# Patient Record
Sex: Male | Born: 2000
Health system: Southern US, Community
[De-identification: ages and names within clinical notes are randomized; demographics above are authoritative.]

## PROBLEM LIST (undated history)

## (undated) DIAGNOSIS — R350 Frequency of micturition: Secondary | ICD-10-CM

## (undated) DIAGNOSIS — S62502A Fracture of unspecified phalanx of left thumb, initial encounter for closed fracture: Secondary | ICD-10-CM

## (undated) HISTORY — PX: LEG TENDON SURGERY: SHX1004

---
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
## 2018-08-20 ENCOUNTER — Telehealth (HOSPITAL_COMMUNITY): Payer: Self-pay

## 2018-08-20 ENCOUNTER — Encounter (HOSPITAL_COMMUNITY): Payer: Self-pay | Admitting: Emergency Medicine

## 2018-08-20 ENCOUNTER — Ambulatory Visit (HOSPITAL_COMMUNITY)
Admission: EM | Admit: 2018-08-20 | Discharge: 2018-08-20 | Disposition: A | Discharge status: Home / Self Care | Payer: Federal, State, Local not specified - PPO | Attending: Family Medicine | Admitting: Family Medicine

## 2018-08-20 DIAGNOSIS — F17210 Nicotine dependence, cigarettes, uncomplicated: Secondary | ICD-10-CM | POA: Insufficient documentation

## 2018-08-20 DIAGNOSIS — Z202 Contact with and (suspected) exposure to infections with a predominantly sexual mode of transmission: Secondary | ICD-10-CM | POA: Diagnosis not present

## 2018-08-20 DIAGNOSIS — B009 Herpesviral infection, unspecified: Secondary | ICD-10-CM

## 2018-08-20 DIAGNOSIS — Z113 Encounter for screening for infections with a predominantly sexual mode of transmission: Secondary | ICD-10-CM

## 2018-08-20 DIAGNOSIS — K1379 Other lesions of oral mucosa: Secondary | ICD-10-CM | POA: Diagnosis not present

## 2018-08-20 MED ORDER — AZITHROMYCIN 250 MG PO TABS
ORAL_TABLET | ORAL | Status: AC
  Filled 2018-08-20: qty 4

## 2018-08-20 MED ORDER — AZITHROMYCIN 250 MG PO TABS
1000.0000 mg | ORAL_TABLET | Freq: Once | ORAL | Status: AC
  Administered 2018-08-20: 1000 mg via ORAL

## 2018-08-20 MED ORDER — CEFTRIAXONE SODIUM 250 MG IJ SOLR
INTRAMUSCULAR | Status: AC
  Filled 2018-08-20: qty 250

## 2018-08-20 MED ORDER — CEFTRIAXONE SODIUM 250 MG IJ SOLR
250.0000 mg | Freq: Once | INTRAMUSCULAR | Status: AC
  Administered 2018-08-20: 250 mg via INTRAMUSCULAR

## 2018-08-20 MED ORDER — LIDOCAINE HCL (PF) 1 % IJ SOLN
INTRAMUSCULAR | Status: AC
  Filled 2018-08-20: qty 2

## 2018-08-20 NOTE — Discharge Instructions (Addendum)
Safe sex is up to you.  We can talk about the consequences of getting a sexually transmitted disease all day long.  Some of the sexually transmitted diseases have a lifelong consequences.

## 2018-08-20 NOTE — ED Triage Notes (Signed)
Pt here for "cold sores" to upper lip

## 2018-08-20 NOTE — ED Provider Notes (Signed)
[facility]    CSN: [id number] Arrival date & time: 08/20/18  1656     History   Chief Complaint Chief Complaint  Patient presents with  . mouth pain    HPI Carl Ibarra is a 17 y.o. male.   Is a 17 year old man who comes in with his parents.  He is sexually active and has developed for the first time vesicles on his upper lip.  He also would like to be treated for gonorrhea since his girlfriend tested positive and was treated at the health department.  He would like to have STI screening.  Patient has no dysuria or discharge.  He has no skin lesions in his perineum.     Past Medical History:  Diagnosis Date  . Fracture of thumb, left, closed 10/13/2015   prox. phalanx - fell from skateboard  . Urinary frequency     Patient Active Problem List   Diagnosis Date Noted  . Fracture of right radius and ulna 01/10/2016    Past Surgical History:  Procedure Laterality Date  . LEG TENDON SURGERY    . OPEN REDUCTION INTERNAL FIXATION (ORIF) PROXIMAL PHALANX Left 11/04/2015   Procedure: OPEN TREATMENT OF LEFT THUMB PROXIMAL PHALANX FRACTURE;  Surgeon: Mack Hookavid Thompson, MD;  Location: [facility];  Service: Orthopedics;  Laterality: Left;       Home Medications    Prior to Admission medications   Medication Sig Start Date End Date Taking? Authorizing Provider  UNKNOWN TO PATIENT Take 1 tablet by mouth once. Pill unknown to pt.    [provider]    Family History Family History  Problem Relation Age of Onset  . Hypertension Father   . Asthma Father   . Stroke Father     Social History Social History   Tobacco Use  . Smoking status: Current Every Day Smoker    Years: 0.50    Types: Cigarettes  . Smokeless tobacco: Never Used  . Tobacco comment: unknown amount daily, per mother  Substance Use Topics  . Alcohol use: No  . Drug use: No     Allergies   Bee pollen; Bee venom; and Peanut butter flavor   Review of  Systems Review of Systems  Skin: Positive for rash.  All other systems reviewed and are negative.    Physical Exam Triage Vital Signs ED Triage Vitals [08/20/18 1742]  Enc Vitals Group     BP 118/67     Pulse Rate (!) 58     Resp 18     Temp 98.4 F (36.9 C)     Temp Source Oral     SpO2 100 %     Weight      Height      Head Circumference      Peak Flow      Pain Score      Pain Loc      Pain Edu?      Excl. in GC?    No data found.  Updated Vital Signs BP 118/67 (BP Location: Right Arm)   Pulse (!) 58   Temp 98.4 F (36.9 C) (Oral)   Resp 18   SpO2 100%   Physical Exam  Constitutional: He appears well-developed and well-nourished.  HENT:  Patient has 2 vesicles on the vermilion border of his upper lip.  Nursing note and vitals reviewed.    UC Treatments / Results  Labs (all labs ordered are listed, but only abnormal results are  displayed) Labs Reviewed  HIV ANTIBODY (ROUTINE TESTING W REFLEX)  RPR  URINE CYTOLOGY ANCILLARY ONLY    EKG None  Radiology No results found.  Procedures Procedures (including critical care time)  Medications Ordered in UC Medications  azithromycin (ZITHROMAX) tablet 1,000 mg (has no administration in time range)  cefTRIAXone (ROCEPHIN) injection 250 mg (has no administration in time range)    Initial Impression / Assessment and Plan / UC Course  I have reviewed the triage vital signs and the nursing notes.  Pertinent labs & imaging results that were available during my care of the patient were reviewed by me and considered in my medical decision making (see chart for details).    Final Clinical Impressions(s) / UC Diagnoses   Final diagnoses:  HSV-1 (herpes simplex virus 1) infection  STD exposure     Discharge Instructions     Safe sex is up to you.  We can talk about the consequences of getting a sexually transmitted disease all day long.  Some of the sexually transmitted diseases have a lifelong  consequences.    ED Prescriptions    None     Controlled Substance Prescriptions [place] Controlled Substance Registry consulted? Not Applicable   Elvina Sidle, MD 08/20/18 1815

## 2018-08-21 LAB — RPR: RPR Ser Ql: NONREACTIVE

## 2018-08-22 LAB — URINE CYTOLOGY ANCILLARY ONLY
Chlamydia: NEGATIVE
Neisseria Gonorrhea: NEGATIVE
Trichomonas: NEGATIVE

## 2018-08-23 LAB — HIV ANTIBODY (ROUTINE TESTING W REFLEX): HIV Screen 4th Generation wRfx: NONREACTIVE

## 2018-11-01 DIAGNOSIS — Z113 Encounter for screening for infections with a predominantly sexual mode of transmission: Secondary | ICD-10-CM | POA: Diagnosis not present

## 2019-02-07 ENCOUNTER — Ambulatory Visit (INDEPENDENT_AMBULATORY_CARE_PROVIDER_SITE_OTHER): Payer: Federal, State, Local not specified - PPO

## 2019-02-07 ENCOUNTER — Encounter: Payer: Self-pay | Admitting: Family Medicine

## 2019-02-07 ENCOUNTER — Ambulatory Visit: Payer: Federal, State, Local not specified - PPO | Admitting: Family Medicine

## 2019-02-07 ENCOUNTER — Other Ambulatory Visit (HOSPITAL_COMMUNITY)
Admission: RE | Admit: 2019-02-07 | Discharge: 2019-02-07 | Disposition: A | Discharge status: Home / Self Care | Payer: Federal, State, Local not specified - PPO | Source: Ambulatory Visit | Attending: Family Medicine | Admitting: Family Medicine

## 2019-02-07 VITALS — BP 100/60 | HR 64 | Temp 98.0°F | Resp 16 | Ht 68.0 in | Wt 155.0 lb

## 2019-02-07 DIAGNOSIS — M25511 Pain in right shoulder: Secondary | ICD-10-CM

## 2019-02-07 DIAGNOSIS — Z113 Encounter for screening for infections with a predominantly sexual mode of transmission: Secondary | ICD-10-CM | POA: Insufficient documentation

## 2019-02-07 DIAGNOSIS — E559 Vitamin D deficiency, unspecified: Secondary | ICD-10-CM

## 2019-02-07 DIAGNOSIS — Z0001 Encounter for general adult medical examination with abnormal findings: Secondary | ICD-10-CM

## 2019-02-07 DIAGNOSIS — M25521 Pain in right elbow: Secondary | ICD-10-CM

## 2019-02-07 DIAGNOSIS — Z0000 Encounter for general adult medical examination without abnormal findings: Secondary | ICD-10-CM | POA: Diagnosis not present

## 2019-02-07 DIAGNOSIS — B009 Herpesviral infection, unspecified: Secondary | ICD-10-CM

## 2019-02-07 DIAGNOSIS — S59901A Unspecified injury of right elbow, initial encounter: Secondary | ICD-10-CM | POA: Diagnosis not present

## 2019-02-07 DIAGNOSIS — S4991XA Unspecified injury of right shoulder and upper arm, initial encounter: Secondary | ICD-10-CM | POA: Diagnosis not present

## 2019-02-07 DIAGNOSIS — M25531 Pain in right wrist: Secondary | ICD-10-CM

## 2019-02-07 LAB — B12 AND FOLATE PANEL
Folate: 12.3 ng/mL (ref >5.9)
Vitamin B-12: 382 pg/mL (ref 211–911)

## 2019-02-07 LAB — COMPREHENSIVE METABOLIC PANEL
ALBUMIN: 4.1 g/dL (ref 3.5–5.2)
ALK PHOS: 64 U/L (ref 39–117)
ALT: 12 U/L (ref 0–53)
AST: 16 U/L (ref 0–37)
BILIRUBIN TOTAL: 0.6 mg/dL (ref 0.2–1.2)
BUN: 10 mg/dL (ref 6–23)
CO2: 26 mEq/L (ref 19–32)
Calcium: 9 mg/dL (ref 8.4–10.5)
Chloride: 106 mEq/L (ref 96–112)
Creatinine, Ser: 0.93 mg/dL (ref 0.40–1.50)
GFR: 125.2 mL/min (ref >60.00)
GLUCOSE: 101 mg/dL — AB (ref 70–99)
Potassium: 4.2 mEq/L (ref 3.5–5.1)
Sodium: 139 mEq/L (ref 135–145)
TOTAL PROTEIN: 6.6 g/dL (ref 6.0–8.3)

## 2019-02-07 LAB — CBC
HCT: 40.1 % (ref 39.0–52.0)
HEMOGLOBIN: 13.7 g/dL (ref 13.0–17.0)
MCHC: 34.2 g/dL (ref 30.0–36.0)
MCV: 96.6 fl (ref 78.0–100.0)
PLATELETS: 243 10*3/uL (ref 150.0–400.0)
RBC: 4.15 Mil/uL — AB (ref 4.22–5.81)
RDW: 12.5 % (ref 11.5–14.6)
WBC: 5.2 10*3/uL (ref 4.5–10.5)

## 2019-02-07 LAB — VITAMIN D 25 HYDROXY (VIT D DEFICIENCY, FRACTURES): VITD: 16.3 ng/mL — ABNORMAL LOW (ref 30.00–100.00)

## 2019-02-07 NOTE — Progress Notes (Signed)
Subjective:    Patient ID: Carl Ibarra, male    DOB: 2000-12-30, 18 y.o.   MRN: [id number]  HPI   Patient presents to clinic to establish with PCP.  Main concern today is getting a complete physical exam and also STD screening.  Patient states he does see the dentist 1-2 times per year, and sees the eye doctor usually every 2 years.  Does not wear glasses or contacts.  Vaccines are up-to-date for his age.  Patient also fell off of skateboard about a month ago, reached out with his right arm.  Now he has had right shoulder, elbow and wrist pain for the past month.  Notices the pain mainly when reaching arm straight up or reaching arm backward to put jacket on.  Past Medical History:  Diagnosis Date  . Fracture of thumb, left, closed 10/13/2015   prox. phalanx - fell from skateboard  . Urinary frequency    Past Surgical History:  Procedure Laterality Date  . LEG TENDON SURGERY    . OPEN REDUCTION INTERNAL FIXATION (ORIF) PROXIMAL PHALANX Left 11/04/2015   Procedure: OPEN TREATMENT OF LEFT THUMB PROXIMAL PHALANX FRACTURE;  Surgeon: Mack Hook, MD;  Location: [facility];  Service: Orthopedics;  Laterality: Left;   Social History   Tobacco Use  . Smoking status: Current Every Day Smoker    Years: 0.50    Types: Cigarettes  . Smokeless tobacco: Never Used  . Tobacco comment: unknown amount daily, per mother  Substance Use Topics  . Alcohol use: No   Family History  Problem Relation Age of Onset  . Depression Mother   . Hypertension Father   . Asthma Father   . Stroke Father   . Heart attack Father   . Depression Father   . Asthma Sister    Review of Systems  Constitutional: Negative for chills, fatigue and fever.  HENT: Negative for congestion, ear pain, sinus pain and sore throat.   Eyes: Negative.   Respiratory: Negative for cough, shortness of breath and wheezing.   Cardiovascular: Negative for chest pain, palpitations and leg swelling.    Gastrointestinal: Negative for abdominal pain, diarrhea, nausea and vomiting.  Genitourinary: Negative for dysuria, frequency and urgency.  Musculoskeletal: +pain right shoulder, elbow, wrist Skin: Negative for color change, pallor and rash.  Neurological: Negative for syncope, light-headedness and headaches.  Psychiatric/Behavioral: The patient is not nervous/anxious.       Objective:   Physical Exam Vitals signs and nursing note reviewed.  Constitutional:      General: He is not in acute distress.    Appearance: He is not toxic-appearing.  HENT:     Head: Normocephalic and atraumatic.     Right Ear: Tympanic membrane, ear canal and external ear normal.     Left Ear: Tympanic membrane, ear canal and external ear normal.     Nose: Nose normal.     Mouth/Throat:     Mouth: Mucous membranes are moist.  Eyes:     General: No scleral icterus.    Extraocular Movements: Extraocular movements intact.     Pupils: Pupils are equal, round, and reactive to light.  Neck:     Musculoskeletal: Neck supple. No neck rigidity.  Cardiovascular:     Rate and Rhythm: Normal rate and regular rhythm.  Pulmonary:     Effort: Pulmonary effort is normal.     Breath sounds: Normal breath sounds.  Genitourinary:    Comments: Declines GU exam Musculoskeletal:  Right shoulder: He exhibits tenderness.     Right elbow: Tenderness found.     Right wrist: He exhibits tenderness.     Comments: Right shoulder pain with reaching right arm straight up above head and behind.  Able to hold right arm straight out in front of him and resist me pushing down.  Pain with range of motion of right elbow and right wrist.  Grips equal and strong.  Range of motion of fingers intact.  Can make a full fist and full extension of all fingers including thumb without issue.  Lymphadenopathy:     Cervical: No cervical adenopathy.  Skin:    General: Skin is warm and dry.     Coloration: Skin is not jaundiced or pale.   Neurological:     Mental Status: He is alert and oriented to person, place, and time.     Gait: Gait normal.  Psychiatric:        Mood and Affect: Mood normal.        Behavior: Behavior normal.    Vitals:   02/07/19 0928  BP: 100/60  Pulse: 64  Resp: 16  Temp: 98 F (36.7 C)  SpO2: 98%      Assessment & Plan:    Well adult exam/STD screening - blood work drawn today including HIV and RPR and HSV screening.  Urine collected for gonorrhea, chlamydia and trichomonas testing.  Discussed healthy diet and regular exercise.  Patient always wears seatbelt when in car.  Discussed safe sex practices including condom use to prevent STD and pregnancy.  Discussed safe sun practices including wearing sunscreen of SPF at least 50 when outdoors, wearing long sleeves and widebrimmed hat to protect skin of face.  Skateboard injury, right shoulder/right wrist/right elbow pain - pain with range of motion of right upper extremity.  We will get x-rays of joints to further investigate.  Discussed possibility of referral to sports medicine or physical therapy once x-ray results are back.  Advised he can use Motrin or Tylenol as needed for pain.  Neck step in plan of care will be determined by x-ray results.  Patient made aware we will contact him when results are available.

## 2019-02-08 ENCOUNTER — Other Ambulatory Visit: Payer: Self-pay | Admitting: Family Medicine

## 2019-02-08 DIAGNOSIS — M25531 Pain in right wrist: Secondary | ICD-10-CM

## 2019-02-08 DIAGNOSIS — M25511 Pain in right shoulder: Secondary | ICD-10-CM

## 2019-02-08 DIAGNOSIS — M25521 Pain in right elbow: Secondary | ICD-10-CM

## 2019-02-08 LAB — RPR: RPR Ser Ql: NONREACTIVE

## 2019-02-08 LAB — URINE CYTOLOGY ANCILLARY ONLY
Chlamydia: NEGATIVE
Neisseria Gonorrhea: NEGATIVE
Trichomonas: NEGATIVE

## 2019-02-08 LAB — THYROID PANEL WITH TSH
Free Thyroxine Index: 1.7 (ref 1.4–3.8)
T3 Uptake: 31 % (ref 22–35)
T4, Total: 5.6 ug/dL (ref 5.1–10.3)
TSH: 1.27 mIU/L (ref 0.40–4.50)

## 2019-02-08 LAB — HIV ANTIBODY (ROUTINE TESTING W REFLEX): HIV 1&2 Ab, 4th Generation: NONREACTIVE

## 2019-02-13 ENCOUNTER — Telehealth: Payer: Self-pay

## 2019-02-13 NOTE — Telephone Encounter (Signed)
Copied from CRM [id number]. Topic: General - Inquiry >> Feb 13, 2019  8:57 AM Baldo Daub L wrote: Reason for CRM:   Pt states he had x-rays done but has never heard results.  Pt would like to know if those are back. Pt can be reached at [phone]

## 2019-02-13 NOTE — Telephone Encounter (Signed)
I will have lab results soon; Waiting on one test to come back still

## 2019-02-13 NOTE — Telephone Encounter (Signed)
Called Pt and gave him his X-ray results, Pt stated he understood with no questions. I told him that a referral was put in for him to see orthpedic

## 2019-02-15 ENCOUNTER — Encounter: Payer: Self-pay | Admitting: Family Medicine

## 2019-02-15 ENCOUNTER — Encounter: Payer: Self-pay | Admitting: Lab

## 2019-02-15 ENCOUNTER — Telehealth: Payer: Self-pay | Admitting: Family Medicine

## 2019-02-15 LAB — HSV 1/2 AB (IGM), IFA W/RFLX TITER
HSV 1 IgM Screen: NEGATIVE
HSV 2 IgM Screen: NEGATIVE

## 2019-02-15 LAB — HSV(HERPES SIMPLEX VRS) I + II AB-IGG
HAV 1 IGG,TYPE SPECIFIC AB: <0.9 index
HSV 2 IGG,TYPE SPECIFIC AB: 16.6 index — ABNORMAL HIGH

## 2019-02-15 MED ORDER — VALACYCLOVIR HCL 1 G PO TABS: 1000.0000 mg | ORAL_TABLET | Freq: Every day | ORAL | 3 refills | Status: DC

## 2019-02-15 MED ORDER — VITAMIN D (ERGOCALCIFEROL) 1.25 MG (50000 UNIT) PO CAPS: 50000.0000 [IU] | ORAL_CAPSULE | ORAL | 0 refills | Status: DC

## 2019-02-15 NOTE — Telephone Encounter (Signed)
Pt received copy of x-rays. Pt needs the work excuse to state he has a fracture and how long to be out of work. He has been out of work since Monday, 02/13/2019.

## 2019-02-15 NOTE — Telephone Encounter (Signed)
Pt needs a copy of the xrays that were taken in our office a week ago. Also request a letter for work explaining that he has a fractured disc. pls call pt

## 2019-02-15 NOTE — Telephone Encounter (Signed)
Pt called [facility] Pt received copy of x-rays. Pt needs the work excuse to state he has a fracture and how long to be out of work. He has been out of work since Monday, 02/13/2019.

## 2019-02-15 NOTE — Addendum Note (Signed)
Addended by: Leanora Cover on: 02/15/2019 09:27 AM   Modules accepted: Orders

## 2019-02-15 NOTE — Addendum Note (Signed)
Addended by: Leanora Cover on: 02/15/2019 10:14 AM   Modules accepted: Orders

## 2019-02-15 NOTE — Telephone Encounter (Signed)
Xray says this: Tiny fracture ulnar styloid of indeterminate age.  Unclear of exact age of fracture, could be old.  When is his orthopedic appt?? This well help determine work note. Also is there light duty he can do?? I do not recall telling patient he has to be out of work.

## 2019-02-15 NOTE — Telephone Encounter (Signed)
Called Pt and I told the Pt that We would like to see what Orthopedics come up with as far as restrictions.

## 2019-02-16 DIAGNOSIS — S43101A Unspecified dislocation of right acromioclavicular joint, initial encounter: Secondary | ICD-10-CM | POA: Diagnosis not present

## 2019-02-16 DIAGNOSIS — S63501A Unspecified sprain of right wrist, initial encounter: Secondary | ICD-10-CM | POA: Diagnosis not present

## 2019-02-24 ENCOUNTER — Telehealth: Payer: Self-pay | Admitting: Family Medicine

## 2019-02-24 NOTE — Telephone Encounter (Signed)
Copied from CRM [id number]. Topic: General - Other >> Feb 24, 2019 12:01 PM Tamela Oddi wrote: Reason for CRM: Patient called to request that the doctor fill out a medical review form so that he can get his license reinstated.  Patient stated he needed it as soon as possible.  Please advise and call patient back if this is possible for him to get this done.  CB# [phone]

## 2019-02-27 NOTE — Telephone Encounter (Signed)
Pt called [facility] 3 days ago, I tried calling Pt today for more info No answer, Will try back later  Reason for CRM: Patient called to request that the doctor fill out a medical review form so that he can get his license reinstated.  Patient stated he needed it as soon as possible.  Please advise and call patient back if this is possible for him to get this done.  CB# [phone]

## 2019-02-27 NOTE — Telephone Encounter (Signed)
Called Pt No answer, left a message to call office to tell him I would have to see this form and what they are requiring clearance of for his license to be re-instated.   It depends on why it was taken and if I feel comfortable clearing him. Depending on why his license was taken, he may need to have another office visit here and/or see a specialist Will try back later Okay for [facility] to talk to Pt

## 2019-02-27 NOTE — Telephone Encounter (Signed)
I would have to see this form and what they are requiring clearance of for his license to be re-instated.   It depends on why it was taken and if I feel comfortable clearing him. Depending on why his license was taken, he may need to have another office visit here and/or see a specialist

## 2019-02-28 DIAGNOSIS — Z0189 Encounter for other specified special examinations: Secondary | ICD-10-CM | POA: Diagnosis not present

## 2019-02-28 NOTE — Telephone Encounter (Signed)
Called Pt and told him that he would need to drop off the form so that it could be looked at and see if it is something NP lauren Guse could fill out or Not. (He didn't state why his license was taken he just stated he needs the form fill out with basic questions if he's able to drive, and about vision) he stated he would drop the form off today, so that Leanora Cover FNP can take a look at it

## 2019-02-28 NOTE — Telephone Encounter (Signed)
I make no promises about my ability to complete this form until I see what questions are on the form.

## 2019-05-10 ENCOUNTER — Other Ambulatory Visit: Payer: Federal, State, Local not specified - PPO

## 2019-10-05 ENCOUNTER — Other Ambulatory Visit: Payer: Self-pay | Admitting: Family Medicine

## 2019-10-05 DIAGNOSIS — E559 Vitamin D deficiency, unspecified: Secondary | ICD-10-CM

## 2020-08-30 DIAGNOSIS — Z20822 Contact with and (suspected) exposure to covid-19: Secondary | ICD-10-CM | POA: Diagnosis not present

## 2020-08-30 DIAGNOSIS — Z03818 Encounter for observation for suspected exposure to other biological agents ruled out: Secondary | ICD-10-CM | POA: Diagnosis not present

## 2020-09-17 ENCOUNTER — Encounter: Payer: Self-pay | Admitting: Nurse Practitioner

## 2020-09-17 ENCOUNTER — Ambulatory Visit: Payer: Federal, State, Local not specified - PPO | Admitting: Nurse Practitioner

## 2020-09-17 ENCOUNTER — Other Ambulatory Visit: Payer: Self-pay

## 2020-09-17 VITALS — BP 120/74 | HR 92 | Temp 97.8°F | Ht 68.0 in | Wt 153.0 lb

## 2020-09-17 DIAGNOSIS — B009 Herpesviral infection, unspecified: Secondary | ICD-10-CM | POA: Diagnosis not present

## 2020-09-17 DIAGNOSIS — A6002 Herpesviral infection of other male genital organs: Secondary | ICD-10-CM

## 2020-09-17 DIAGNOSIS — A6000 Herpesviral infection of urogenital system, unspecified: Secondary | ICD-10-CM | POA: Insufficient documentation

## 2020-09-17 MED ORDER — VALACYCLOVIR HCL 1 G PO TABS: 1000.0000 mg | ORAL_TABLET | Freq: Every day | ORAL | 3 refills | Status: DC

## 2020-09-17 NOTE — Progress Notes (Signed)
Established Patient Office Visit  Subjective:  Patient ID: Carl Ibarra, male    DOB: 2001-03-26  Age: 19 y.o. MRN: [id number]  CC:  Chief Complaint  Patient presents with  . Transitions Of Care    medication refills    HPI Carl Ibarra is a 19 year old male who presents to transfer care. He was previously followed by Ander Purpura. He needs to get  refills on his Valtrex.  He has had a outbreak of his genital herpes.  He denies any other concerns today.   Hx of genital herpes: Break out in the last  2 weeks. He came off his Valtrex  in 2 months and thought he was doing well. He typically gets only 3 out breaks a year, but wants to stay on the daily therapy. He is currently avoiding intimate contact with the active blisters.  Past Medical History:  Diagnosis Date  . Fracture of thumb, left, closed 10/13/2015   prox. phalanx - fell from skateboard  . Urinary frequency     Past Surgical History:  Procedure Laterality Date  . LEG TENDON SURGERY    . OPEN REDUCTION INTERNAL FIXATION (ORIF) PROXIMAL PHALANX Left 11/04/2015   Procedure: OPEN TREATMENT OF LEFT THUMB PROXIMAL PHALANX FRACTURE;  Surgeon: Milly Jakob, MD;  Location: [place];  Service: Orthopedics;  Laterality: Left;    Family History  Problem Relation Age of Onset  . Depression Mother   . Hypertension Father   . Asthma Father   . Stroke Father   . Heart attack Father   . Depression Father   . Asthma Sister     Social History   Socioeconomic History  . Marital status: Single    Spouse name: Not on file  . Number of children: Not on file  . Years of education: Not on file  . Highest education level: Not on file  Occupational History  . Not on file  Tobacco Use  . Smoking status: Current Every Day Smoker    Years: 0.50    Types: Cigarettes  . Smokeless tobacco: Never Used  . Tobacco comment: unknown amount daily, per mother  Vaping Use  . Vaping Use: Never used  Substance and Sexual  Activity  . Alcohol use: No  . Drug use: No  . Sexual activity: Yes  Other Topics Concern  . Not on file  Social History Narrative   Lives at home with mom, dad, 2 siblings. Pt currently smokes. 2 dogs, 1 cat, 1 bird and  at home       [employer]       Girlfriend    Social Determinants of Health   Financial Resource Strain:   . Difficulty of Paying Living Expenses: Not on file  Food Insecurity:   . Worried About Charity fundraiser in the Last Year: Not on file  . Ran Out of Food in the Last Year: Not on file  Transportation Needs:   . Lack of Transportation (Medical): Not on file  . Lack of Transportation (Non-Medical): Not on file  Physical Activity:   . Days of Exercise per Week: Not on file  . Minutes of Exercise per Session: Not on file  Stress:   . Feeling of Stress : Not on file  Social Connections:   . Frequency of Communication with Friends and Family: Not on file  . Frequency of Social Gatherings with Friends and Family: Not on file  . Attends Religious Services: Not  on file  . Active Member of Clubs or Organizations: Not on file  . Attends Archivist Meetings: Not on file  . Marital Status: Not on file  Intimate Partner Violence:   . Fear of Current or Ex-Partner: Not on file  . Emotionally Abused: Not on file  . Physically Abused: Not on file  . Sexually Abused: Not on file    Outpatient Medications Prior to Visit  Medication Sig Dispense Refill  . valACYclovir (VALTREX) 1000 MG tablet Take 1 tablet (1,000 mg total) by mouth daily. 90 tablet 3  . Vitamin D, Ergocalciferol, (DRISDOL) 1.25 MG (50000 UT) CAPS capsule TAKE 1 CAPSULE BY MOUTH EVERY 7 DAYS 12 capsule 0   No facility-administered medications prior to visit.    Allergies  Allergen Reactions  . Bee Pollen Anaphylaxis  . Bee Venom Anaphylaxis  . Peanut Butter Flavor Swelling    Throat swells    Review of Systems  Constitutional: Negative for chills and fever.  HENT:  Negative.   Respiratory: Negative for cough and shortness of breath.   Cardiovascular: Negative for chest pain.  Gastrointestinal: Negative for abdominal pain.  Genitourinary: Positive for genital sores. Negative for difficulty urinating, discharge, dysuria, flank pain, frequency, hematuria, penile pain, penile swelling, scrotal swelling, testicular pain and urgency.  Musculoskeletal: Negative.       Objective:    Physical Exam Vitals reviewed.  Constitutional:      Appearance: He is normal weight.  HENT:     Head: Normocephalic and atraumatic.  Eyes:     Conjunctiva/sclera: Conjunctivae normal.     Pupils: Pupils are equal, round, and reactive to light.  Cardiovascular:     Rate and Rhythm: Normal rate and regular rhythm.     Pulses: Normal pulses.     Heart sounds: Normal heart sounds.  Pulmonary:     Effort: Pulmonary effort is normal.     Breath sounds: Normal breath sounds.  Abdominal:     Palpations: Abdomen is soft.     Tenderness: There is no abdominal tenderness.  Genitourinary:    Penis: Normal.      Testes: Normal.     Comments: Herpes dry  blisters on suprapubic area only. No lesions on penis or scrotum.  Musculoskeletal:        General: Normal range of motion.     Cervical back: Normal range of motion and neck supple.  Skin:    General: Skin is warm and dry.  Neurological:     General: No focal deficit present.     Mental Status: He is alert and oriented to person, place, and time.  Psychiatric:        Mood and Affect: Mood normal.        Behavior: Behavior normal.     BP 120/74 (BP Location: Left Arm, Patient Position: Sitting, Cuff Size: Normal)   Pulse 92   Temp 97.8 F (36.6 C) (Oral)   Ht $R'5\' 8"'CD$  (1.727 m)   Wt 153 lb (69.4 kg)   SpO2 97%   BMI 23.26 kg/m  Wt Readings from Last 3 Encounters:  09/17/20 153 lb (69.4 kg)  02/07/19 155 lb (70.3 kg)  04/13/17 140 lb (63.5 kg) (34 %, Z= -0.42)*   * Growth percentiles are based on CDC (Boys, 2-20  Years) data.   Pulse Readings from Last 3 Encounters:  09/17/20 92  02/07/19 64  08/20/18 (!) 58    BP Readings from Last 3 Encounters:  09/17/20 120/74  02/07/19 100/60  08/20/18 118/67    No results found for: CHOL, HDL, LDLCALC, LDLDIRECT, TRIG, CHOLHDL    Health Maintenance Due  Topic Date Due  . Hepatitis C Screening  Never done  . COVID-19 Vaccine (1) Never done  . INFLUENZA VACCINE  06/30/2020    There are no preventive care reminders to display for this patient.  Lab Results  Component Value Date   TSH 1.27 02/07/2019   Lab Results  Component Value Date   WBC 5.8 09/17/2020   HGB 14.3 09/17/2020   HCT 42.1 09/17/2020   MCV 95.3 09/17/2020   PLT 239.0 09/17/2020   Lab Results  Component Value Date   NA 139 09/17/2020   K 3.7 09/17/2020   CO2 28 09/17/2020   GLUCOSE 100 (H) 09/17/2020   BUN 13 09/17/2020   CREATININE 1.04 09/17/2020   BILITOT 1.1 09/17/2020   ALKPHOS 55 09/17/2020   AST 15 09/17/2020   ALT 13 09/17/2020   PROT 5.7 (L) 09/17/2020   ALBUMIN 3.9 09/17/2020   CALCIUM 9.1 09/17/2020   ANIONGAP 6 07/20/2017   GFR 101.63 09/17/2020   No results found for: CHOL No results found for: HDL No results found for: LDLCALC No results found for: TRIG No results found for: CHOLHDL No results found for: HGBA1C    Assessment & Plan:   Problem List Items Addressed This Visit      Genitourinary   Herpes genitalis - Primary   Relevant Medications   valACYclovir (VALTREX) 1000 MG tablet   Other Relevant Orders   CBC with Differential/Platelet (Completed)   Comp Met (CMET) (Completed)     Other   HSV-2 infection   Relevant Medications   valACYclovir (VALTREX) 1000 MG tablet   Other Relevant Orders   HIV Antibody (routine testing w rflx) (Completed)   RPR (Completed)   Urine cytology ancillary only([place])      Meds ordered this encounter  Medications  . valACYclovir (VALTREX) 1000 MG tablet    Sig: Take 1 tablet (1,000 mg  total) by mouth daily.    Dispense:  90 tablet    Refill:  3    Order Specific Question:   Supervising Provider    Answer:   Einar Pheasant [[id number]]   Please go to the lab today.  Refills of your Valtrex 1 pill daily at the pharmacy.  Take as directed one pill daily for prevention of break out.   Abstain from sexual genital contact while you have active blisters.Safe sex counseling. Alarm symptoms reviewed.   Recommended the Covid vaccine  Please follow-up office visit for complete physical exam and recheck of herpes breakout in 1  month.  Follow-up: Return in about 4 weeks (around 10/15/2020).   This visit occurred during the SARS-CoV-2 public health emergency.  Safety protocols were in place, including screening questions prior to the visit, additional usage of staff PPE, and extensive cleaning of exam room while observing appropriate contact time as indicated for disinfecting solutions.   Denice Paradise, NP

## 2020-09-17 NOTE — Patient Instructions (Addendum)
Please go to the lab today.  Refills of your Valtrex 1 pill daily at the pharmacy.  Take as directed one pill daily for prevention of break out.   Abstain from sexual genital contact while you have active blisters.  Please follow-up office visit for complete physical exam and recheck of herpes breakout in 1 month.    Genital Herpes Genital herpes is a common sexually transmitted infection (STI) that is caused by a virus. The virus spreads from person to person through sexual contact. Infection can cause itching, blisters, and sores around the genitals or rectum. Symptoms may last several days and then go away This is called an outbreak. However, the virus remains in your body, so you may have more outbreaks in the future. The time between outbreaks varies and can be months or years. Genital herpes affects men and women. It is particularly concerning for pregnant women because the virus can be passed to the baby during delivery and can cause serious problems. Genital herpes is also a concern for people who have a weak disease-fighting (immune) system. What are the causes? This condition is caused by the herpes simplex virus (HSV) type 1 or type 2. The virus may spread through:  Sexual contact with an infected person, including vaginal, anal, and oral sex.  Contact with fluid from a herpes sore.  The skin. This means that you can get herpes from an infected partner even if he or she does not have a visible sore or does not know that he or she is infected. What increases the risk? You are more likely to develop this condition if:  You have sex with many partners.  You do not use latex condoms during sex. What are the signs or symptoms? Most people do not have symptoms (asymptomatic) or have mild symptoms that may be mistaken for other skin problems. Symptoms may include:  Small red bumps near the genitals, rectum, or mouth. These bumps turn into blisters and then turn into  sores.  Flu-like symptoms, including: ? Fever. ? Body aches. ? Swollen lymph nodes. ? Headache.  Painful urination.  Pain and itching in the genital area or rectal area.  Vaginal discharge.  Tingling or shooting pain in the legs and buttocks. Generally, symptoms are more severe and last longer during the first (primary) outbreak. Flu-like symptoms are also more common during the primary outbreak. How is this diagnosed? Genital herpes may be diagnosed based on:  A physical exam.  Your medical history.  Blood tests.  A test of a fluid sample (culture) from an open sore. How is this treated? There is no cure for this condition, but treatment with antiviral medicines that are taken by mouth (orally) can do the following:  Speed up healing and relieve symptoms.  Help to reduce the spread of the virus to sexual partners.  Limit the chance of future outbreaks, or make future outbreaks shorter.  Lessen symptoms of future outbreaks. Your health care provider may also recommend pain relief medicines, such as aspirin or ibuprofen. Follow these instructions at home: Sexual activity  Do not have sexual contact during active outbreaks.  Practice safe sex. Latex condoms and male condoms may help prevent the spread of the herpes virus. General instructions  Keep the affected areas dry and clean.  Take over-the-counter and prescription medicines only as told by your health care provider.  Avoid rubbing or touching blisters and sores. If you do touch blisters or sores: ? Wash your hands thoroughly with soap  and water. ? Do not touch your eyes afterward.  To help relieve pain or itching, you may take the following actions as directed by your health care provider: ? Apply a cold, wet cloth (cold compress) to affected areas 4-6 times a day. ? Apply a substance that protects your skin and reduces bleeding (astringent). ? Apply a gel that helps relieve pain around sores (lidocaine  gel). ? Take a warm, shallow bath that cleans the genital area (sitz bath).  Keep all follow-up visits as told by your health care provider. This is important. How is this prevented?  Use condoms. Although anyone can get genital herpes during sexual contact, even with the use of a condom, a condom can provide some protection.  Avoid having multiple sexual partners.  Talk with your sexual partner about any symptoms either of you may have. Also, talk with your partner about any history of STIs.  Get tested for STIs before you have sex. Ask your partner to do the same.  Do not have sexual contact if you have symptoms of genital herpes. Contact a health care provider if:  Your symptoms are not improving with medicine.  Your symptoms return.  You have new symptoms.  You have a fever.  You have abdominal pain.  You have redness, swelling, or pain in your eye.  You notice new sores on other parts of your body.  You are a woman and experience bleeding between menstrual periods.  You have had herpes and you become pregnant or plan to become pregnant. Summary  Genital herpes is a common sexually transmitted infection (STI) that is caused by the herpes simplex virus (HSV) type 1 or type 2.  These viruses are most often spread through sexual contact with an infected person.  You are more likely to develop this condition if you have sex with many partners or you have unprotected sex.  Most people do not have symptoms (asymptomatic) or have mild symptoms that may be mistaken for other skin problems. Symptoms occur as outbreaks that may happen months or years apart.  There is no cure for this condition, but treatment with oral antiviral medicines can reduce symptoms, reduce the chance of spreading the virus to a partner, prevent future outbreaks, or shorten future outbreaks. This information is not intended to replace advice given to you by your health care provider. Make sure you  discuss any questions you have with your health care provider. Document Revised: 05/23/2018 Document Reviewed: 10/16/2016 Elsevier Patient Education  2020 [employer].

## 2020-09-18 LAB — CBC WITH DIFFERENTIAL/PLATELET
Basophils Absolute: 0.1 10*3/uL (ref 0.0–0.1)
Basophils Relative: 1.3 % (ref 0.0–3.0)
Eosinophils Absolute: 0 10*3/uL (ref 0.0–0.7)
Eosinophils Relative: 0.7 % (ref 0.0–5.0)
HCT: 42.1 % (ref 39.0–52.0)
Hemoglobin: 14.3 g/dL (ref 13.0–17.0)
Lymphocytes Relative: 25 % (ref 12.0–46.0)
Lymphs Abs: 1.4 10*3/uL (ref 0.7–4.0)
MCHC: 34 g/dL (ref 30.0–36.0)
MCV: 95.3 fl (ref 78.0–100.0)
Monocytes Absolute: 0.4 10*3/uL (ref 0.1–1.0)
Monocytes Relative: 7.4 % (ref 3.0–12.0)
Neutro Abs: 3.8 10*3/uL (ref 1.4–7.7)
Neutrophils Relative %: 65.6 % (ref 43.0–77.0)
Platelets: 239 10*3/uL (ref 150.0–400.0)
RBC: 4.41 Mil/uL (ref 4.22–5.81)
RDW: 12.5 % (ref 11.5–15.5)
WBC: 5.8 10*3/uL (ref 4.0–10.5)

## 2020-09-18 LAB — COMPREHENSIVE METABOLIC PANEL
ALT: 13 U/L (ref 0–53)
AST: 15 U/L (ref 0–37)
Albumin: 3.9 g/dL (ref 3.5–5.2)
Alkaline Phosphatase: 55 U/L (ref 39–117)
BUN: 13 mg/dL (ref 6–23)
CO2: 28 mEq/L (ref 19–32)
Calcium: 9.1 mg/dL (ref 8.4–10.5)
Chloride: 104 mEq/L (ref 96–112)
Creatinine, Ser: 1.04 mg/dL (ref 0.40–1.50)
GFR: 101.63 mL/min (ref >60.00)
Glucose, Bld: 100 mg/dL — ABNORMAL HIGH (ref 70–99)
Potassium: 3.7 mEq/L (ref 3.5–5.1)
Sodium: 139 mEq/L (ref 135–145)
Total Bilirubin: 1.1 mg/dL (ref 0.2–1.2)
Total Protein: 5.7 g/dL — ABNORMAL LOW (ref 6.0–8.3)

## 2020-09-18 LAB — RPR: RPR Ser Ql: NONREACTIVE

## 2020-09-18 LAB — HIV ANTIBODY (ROUTINE TESTING W REFLEX): HIV 1&2 Ab, 4th Generation: NONREACTIVE

## 2020-10-03 ENCOUNTER — Encounter: Payer: Federal, State, Local not specified - PPO | Admitting: Nurse Practitioner

## 2020-10-18 ENCOUNTER — Ambulatory Visit: Payer: Federal, State, Local not specified - PPO | Admitting: Nurse Practitioner

## 2020-10-21 ENCOUNTER — Ambulatory Visit: Payer: Federal, State, Local not specified - PPO | Admitting: Nurse Practitioner

## 2020-10-21 DIAGNOSIS — Z0289 Encounter for other administrative examinations: Secondary | ICD-10-CM

## 2021-01-03 IMAGING — DX RIGHT ELBOW - COMPLETE 3+ VIEW
4 series · 4 of 4 positions shown · non-contrast
Comparison: None.

CLINICAL DATA: Fall 1 month ago.  Pain

EXAM:
RIGHT ELBOW - COMPLETE 3+ VIEW

[elbow ap]
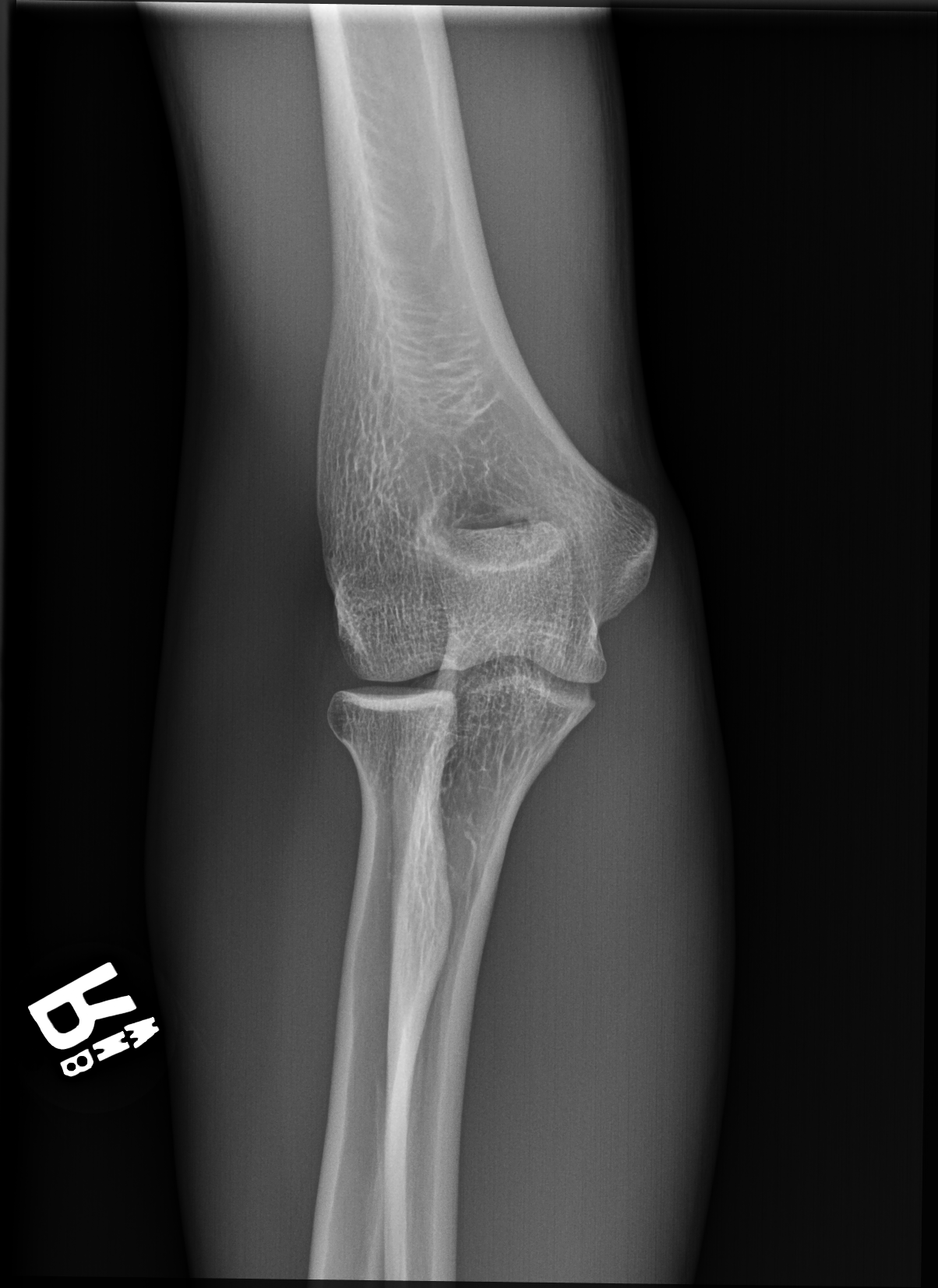

[elbow obl (oblique) (1 of 2)]
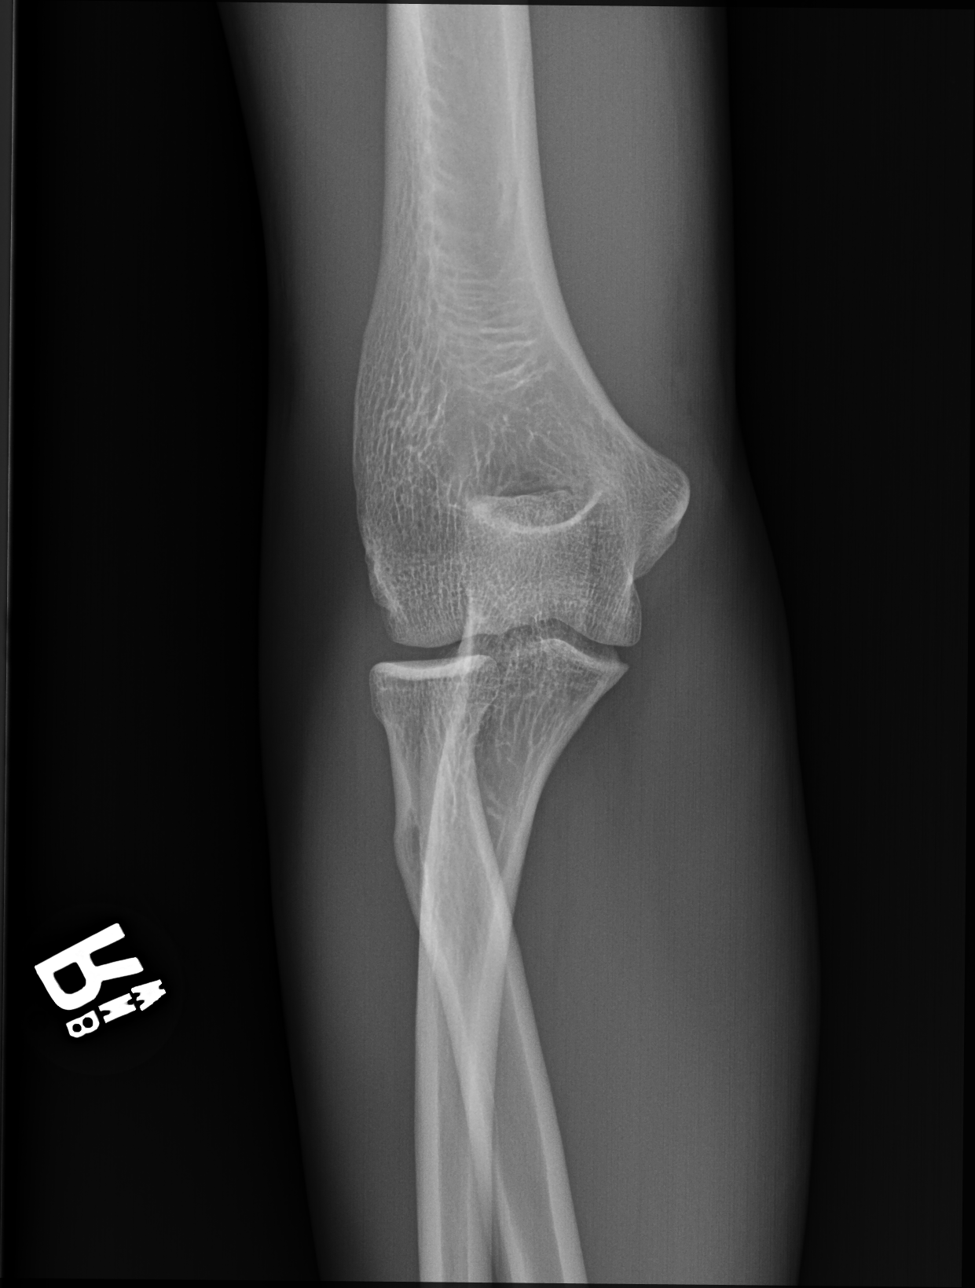

[elbow obl (oblique) (2 of 2)]
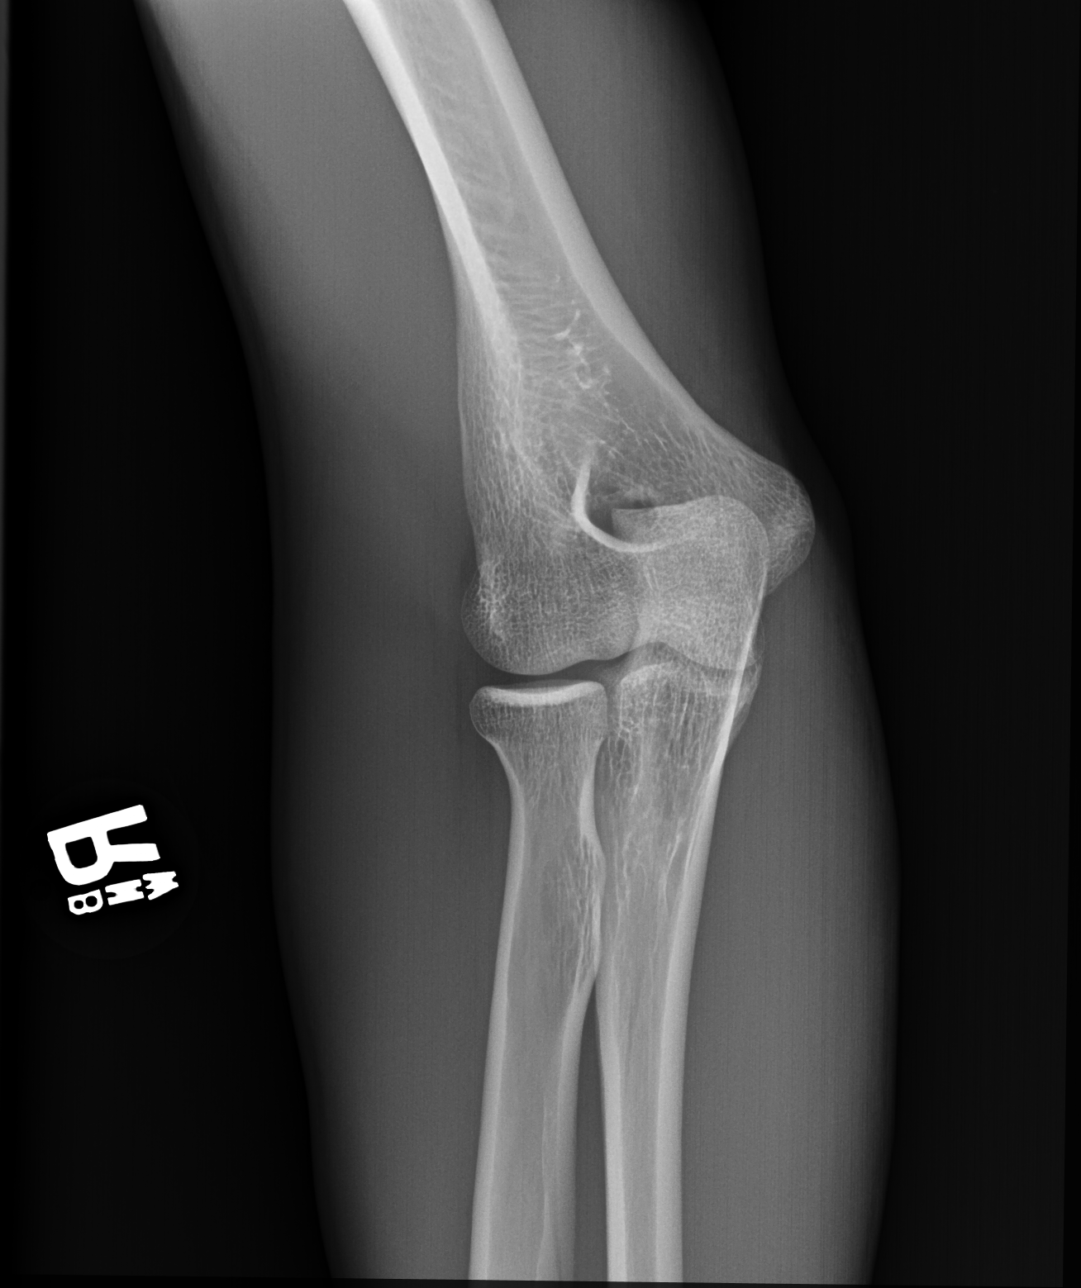

[elbow lat]
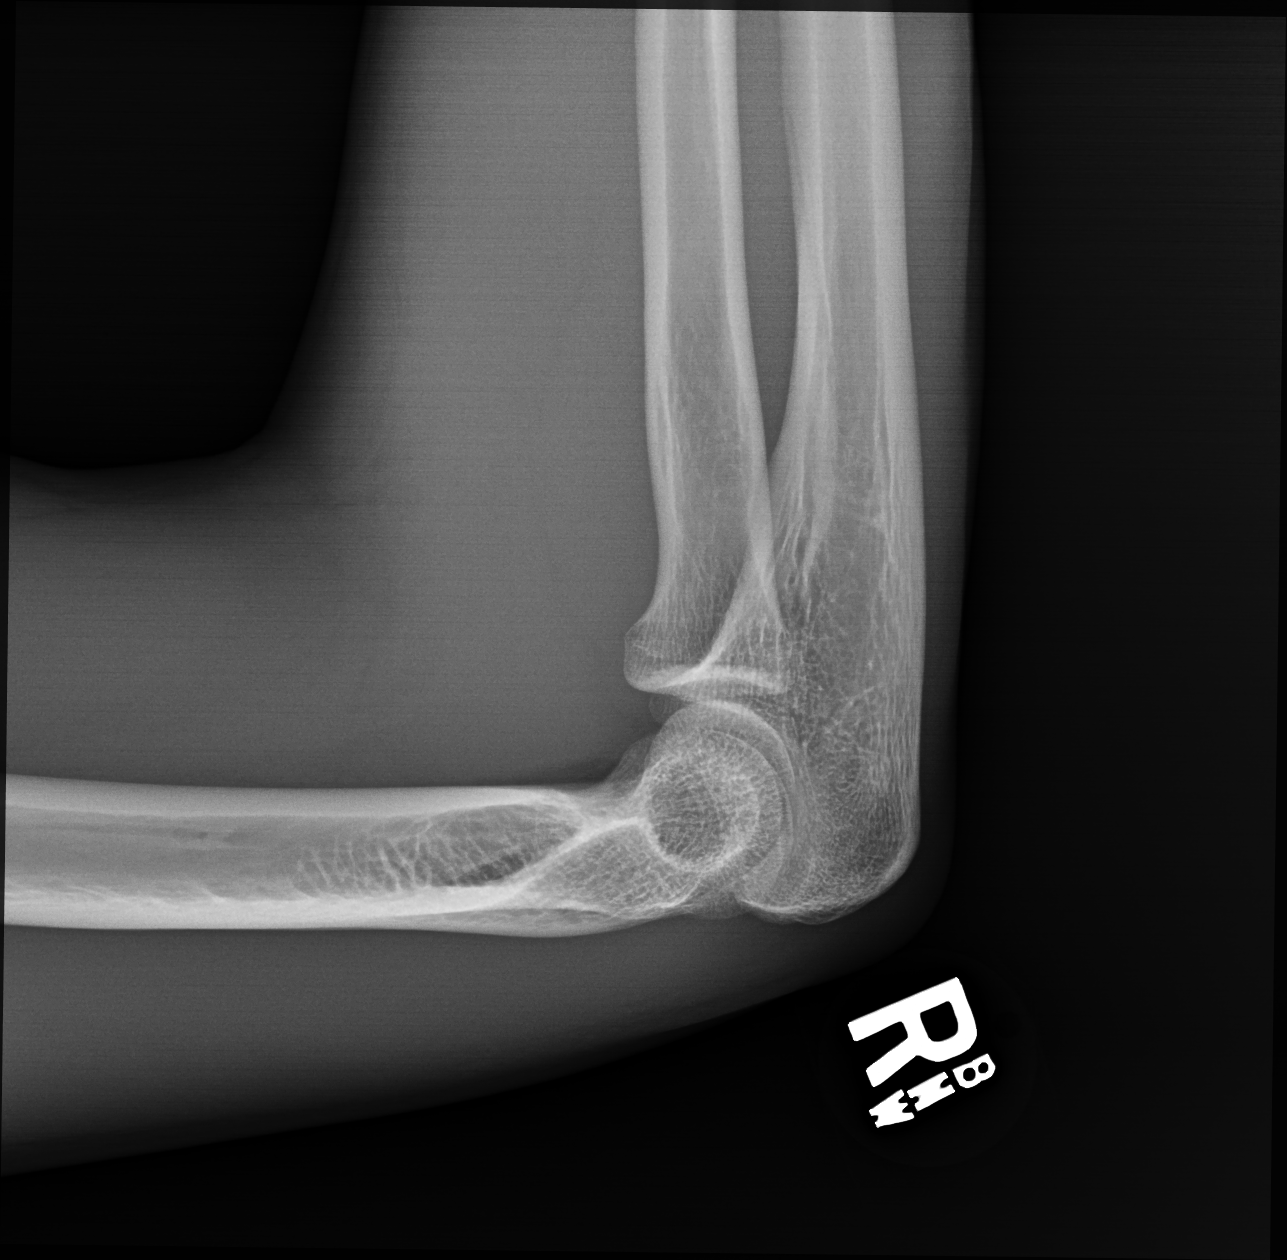

[4 of 4 positions shown; findings below may reference images not displayed]

FINDINGS: There is no evidence of fracture, dislocation, or joint effusion.
There is no evidence of arthropathy or other focal bone abnormality.
Soft tissues are unremarkable.
IMPRESSION: Negative.

## 2021-01-10 ENCOUNTER — Other Ambulatory Visit: Payer: Self-pay

## 2021-01-10 ENCOUNTER — Encounter (HOSPITAL_COMMUNITY): Payer: Self-pay | Admitting: Emergency Medicine

## 2021-01-10 ENCOUNTER — Ambulatory Visit (HOSPITAL_COMMUNITY)
Admission: EM | Admit: 2021-01-10 | Discharge: 2021-01-10 | Disposition: A | Discharge status: Home / Self Care | Payer: Federal, State, Local not specified - PPO

## 2021-01-10 ENCOUNTER — Emergency Department (HOSPITAL_COMMUNITY)
Admission: EM | Admit: 2021-01-10 | Discharge: 2021-01-10 | Disposition: A | Discharge status: Home / Self Care | Payer: Federal, State, Local not specified - PPO | Attending: Emergency Medicine | Admitting: Emergency Medicine

## 2021-01-10 DIAGNOSIS — Z5321 Procedure and treatment not carried out due to patient leaving prior to being seen by health care provider: Secondary | ICD-10-CM | POA: Insufficient documentation

## 2021-01-10 DIAGNOSIS — M25521 Pain in right elbow: Secondary | ICD-10-CM | POA: Insufficient documentation

## 2021-01-10 NOTE — ED Notes (Signed)
Pt did not respond when called for vitals check 

## 2021-01-10 NOTE — ED Triage Notes (Addendum)
Patient wanted to be evaluated for having a possible seizure. Patient states she woke up this morning at 0300 laying outside in the grass, patient states last thing he remembers before that was sitting outside with a friend. Patient alert and oriented at this time, denies history of seizures.  Patient also complains of pain to right elbow after incident.

## 2021-01-10 NOTE — ED Notes (Signed)
Per providers, pt is to go directly to ED due to seizure yesterday with LOC.

## 2021-01-10 NOTE — ED Notes (Signed)
Pt did not respond for vitals check X3

## 2021-01-10 NOTE — ED Notes (Signed)
Kimberly (mother#[phone]) called/would like for patient to call her back. She tried calling him on his cell phone-no answer.  Thank you

## 2021-02-25 DIAGNOSIS — F1220 Cannabis dependence, uncomplicated: Secondary | ICD-10-CM | POA: Diagnosis not present

## 2022-02-24 ENCOUNTER — Encounter (HOSPITAL_COMMUNITY): Payer: Self-pay | Admitting: Emergency Medicine

## 2022-02-24 ENCOUNTER — Ambulatory Visit (HOSPITAL_COMMUNITY)
Admission: EM | Admit: 2022-02-24 | Discharge: 2022-02-24 | Disposition: A | Discharge status: Home / Self Care | Payer: Federal, State, Local not specified - PPO | Attending: Nurse Practitioner | Admitting: Nurse Practitioner

## 2022-02-24 DIAGNOSIS — B009 Herpesviral infection, unspecified: Secondary | ICD-10-CM | POA: Diagnosis not present

## 2022-02-24 DIAGNOSIS — K529 Noninfective gastroenteritis and colitis, unspecified: Secondary | ICD-10-CM

## 2022-02-24 LAB — POCT URINALYSIS DIPSTICK, ED / UC
Bilirubin Urine: NEGATIVE
Glucose, UA: NEGATIVE mg/dL
Hgb urine dipstick: NEGATIVE
Ketones, ur: NEGATIVE mg/dL
Nitrite: NEGATIVE
Protein, ur: NEGATIVE mg/dL
Specific Gravity, Urine: 1.025 (ref 1.005–1.030)
Urobilinogen, UA: 0.2 mg/dL (ref 0.0–1.0)
pH: 7 (ref 5.0–8.0)

## 2022-02-24 MED ORDER — ONDANSETRON HCL 4 MG PO TABS: 4.0000 mg | ORAL_TABLET | Freq: Three times a day (TID) | ORAL | 0 refills | Status: AC | PRN

## 2022-02-24 MED ORDER — VALACYCLOVIR HCL 1 G PO TABS: 1000.0000 mg | ORAL_TABLET | Freq: Every day | ORAL | 2 refills | Status: AC

## 2022-02-24 NOTE — ED Notes (Signed)
Pt adds that needs refill of valacyclovir ? ?

## 2022-02-24 NOTE — Discharge Instructions (Addendum)
Your urinalysis was negative for infection. ?Take medication as prescribed. ?Your symptoms appear to be improving at this time.  Recommend eating a brat diet (bananas, rice, applesauce, and toast), if you are able to tolerate that step up to a soft diet to include mashed potatoes, chicken or fish.  If you are able to tolerate that may return to regular diet.  Increase fluids.  Get plenty of rest. ?Follow-up if symptoms worsen or do not improve. ?

## 2022-02-24 NOTE — ED Provider Notes (Signed)
?[facility] ? ? ? ?CSN: [id number] ?Arrival date & time: 02/24/22  1532 ? ? ?  ? ?History   ?Chief Complaint ?Chief Complaint  ?Patient presents with  ? Abdominal Pain  ? Emesis  ? Diarrhea  ? Medication Refill  ? ? ?HPI ?Carl Ibarra is a 21 y.o. male.  ? ?The patient is a 21 year old male who presents for gastrointestinal symptoms.  Patient states symptoms started 3 days ago when he ate a very "rare" steak.  He states after he ate that he developed nausea, vomiting, diarrhea, and abdominal pain.  He states since his symptoms started, he is starting to feel better.  He states that he has vomited 1 time today and has had 2-3 loose stools.  He does have abdominal pain associated with his nausea, vomiting, and diarrhea.  States episodes of abdominal pain are lasting about 2 to 3 minutes.  He does he does state less frequency of abdominal pain.  States that he had little Debbie snacks prior to vomiting today.  He reports that his stools are beginning to become more formed.  He also reports decreased vomiting over the last 24 hours compared to when his symptoms started.  He denies fever, chills, urinary symptoms.  He has not been taking any medication for symptoms. ? ?Patient also requesting refill for his Valtrex.  Denies any recent symptoms at this time, but states he would just like to have the medication on hand. ? ?The history is provided by the patient.  ?Emesis ?Associated symptoms: abdominal pain and diarrhea   ?Associated symptoms: no fever   ?Diarrhea ?Associated symptoms: abdominal pain and vomiting   ?Associated symptoms: no fever   ?Medication Refill ? ?Past Medical History:  ?Diagnosis Date  ? Fracture of thumb, left, closed 10/13/2015  ? prox. phalanx - fell from skateboard  ? Urinary frequency   ? ? ?Patient Active Problem List  ? Diagnosis Date Noted  ? Herpes genitalis 09/17/2020  ? HSV-2 infection 09/17/2020  ? ? ?Past Surgical History:  ?Procedure Laterality Date  ? LEG TENDON SURGERY    ?  OPEN REDUCTION INTERNAL FIXATION (ORIF) PROXIMAL PHALANX Left 11/04/2015  ? Procedure: OPEN TREATMENT OF LEFT THUMB PROXIMAL PHALANX FRACTURE;  Surgeon: Mack Hook, MD;  Location: [facility];  Service: Orthopedics;  Laterality: Left;  ? ? ? ? ? ?Home Medications   ? ?Prior to Admission medications   ?Medication Sig Start Date End Date Taking? Authorizing Provider  ?valACYclovir (VALTREX) 1000 MG tablet Take 1 tablet (1,000 mg total) by mouth daily. 09/17/20   Theadore Nan, NP  ? ? ?Family History ?Family History  ?Problem Relation Age of Onset  ? Depression Mother   ? Hypertension Father   ? Asthma Father   ? Stroke Father   ? Heart attack Father   ? Depression Father   ? Asthma Sister   ? ? ?Social History ?Social History  ? ?Tobacco Use  ? Smoking status: Every Day  ?  Years: 0.50  ?  Types: Cigarettes  ? Smokeless tobacco: Never  ? Tobacco comments:  ?  unknown amount daily, per mother  ?Vaping Use  ? Vaping Use: Never used  ?Substance Use Topics  ? Alcohol use: No  ? Drug use: No  ? ? ? ?Allergies   ?Bee pollen, Bee venom, and Peanut butter flavor ? ? ?Review of Systems ?Review of Systems  ?Constitutional:  Positive for activity change and appetite change. Negative for fever.  ?  Cardiovascular: Negative.   ?Gastrointestinal:  Positive for abdominal pain, diarrhea, nausea and vomiting.  ?Genitourinary: Negative.   ?Skin: Negative.   ?Psychiatric/Behavioral: Negative.    ? ? ?Physical Exam ?Triage Vital Signs ?ED Triage Vitals  ?Enc Vitals Group  ?   BP 02/24/22 1653 111/73  ?   Pulse Rate 02/24/22 1653 70  ?   Resp 02/24/22 1653 14  ?   Temp 02/24/22 1653 98.9 ?F (37.2 ?C)  ?   Temp Source 02/24/22 1653 Oral  ?   SpO2 02/24/22 1653 97 %  ?   Weight --   ?   Height --   ?   Head Circumference --   ?   Peak Flow --   ?   Pain Score 02/24/22 1651 5  ?   Pain Loc --   ?   Pain Edu? --   ?   Excl. in GC? --   ? ?No data found. ? ?Updated Vital Signs ?BP 111/73 (BP Location: Left Arm)   Pulse 70    Temp 98.9 ?F (37.2 ?C) (Oral)   Resp 14   SpO2 97%  ? ?Visual Acuity ?Right Eye Distance:   ?Left Eye Distance:   ?Bilateral Distance:   ? ?Right Eye Near:   ?Left Eye Near:    ?Bilateral Near:    ? ?Physical Exam ?Vitals reviewed.  ?Constitutional:   ?   General: He is not in acute distress. ?   Appearance: He is well-developed.  ?HENT:  ?   Head: Normocephalic and atraumatic.  ?   Mouth/Throat:  ?   Mouth: Mucous membranes are moist.  ?Eyes:  ?   Extraocular Movements: Extraocular movements intact.  ?   Pupils: Pupils are equal, round, and reactive to light.  ?Cardiovascular:  ?   Rate and Rhythm: Normal rate and regular rhythm.  ?   Heart sounds: Normal heart sounds.  ?Pulmonary:  ?   Effort: Pulmonary effort is normal.  ?   Breath sounds: Normal breath sounds.  ?Abdominal:  ?   General: Bowel sounds are normal. There is no distension.  ?   Palpations: Abdomen is soft.  ?   Tenderness: There is no abdominal tenderness.  ?Skin: ?   General: Skin is warm and dry.  ?   Capillary Refill: Capillary refill takes less than 2 seconds.  ?Neurological:  ?   General: No focal deficit present.  ?   Mental Status: He is alert and oriented to person, place, and time.  ?Psychiatric:     ?   Mood and Affect: Mood normal.     ?   Behavior: Behavior normal.  ? ? ? ?UC Treatments / Results  ?Labs ?(all labs ordered are listed, but only abnormal results are displayed) ?Labs Reviewed  ?POCT URINALYSIS DIPSTICK, ED / UC - Abnormal; Notable for the following components:  ?    Result Value  ? Leukocytes,Ua TRACE (*)   ? All other components within normal limits  ? ? ?EKG ? ? ?Radiology ?No results found. ? ?Procedures ?Procedures (including critical care time) ? ?Medications Ordered in UC ?Medications - No data to display ? ?Initial Impression / Assessment and Plan / UC Course  ?I have reviewed the triage vital signs and the nursing notes. ? ?Pertinent labs & imaging results that were available during my care of the patient were  reviewed by me and considered in my medical decision making (see chart for details). ? ?The patient is a  21 year old male who presents for gastrointestinal symptoms.  Symptoms started about 3 days ago after eating a rare steak.  Patient admits to improvement of his symptoms at this time.  There is no concern for acute abdomen as he does not have any fever, chills, or persistent abdominal pain.  Patient does continue to have intermittent nausea.  Will provide a prescription for ondansetron.  Patient encouraged to increase fluids, and get plenty of rest.  Informed that he may take Tylenol as needed for pain, fever, or general discomfort.  Also recommended a brat diet until his symptoms improve, if able to tolerate a brat diet, step up to soft foods, if able to tolerate may return to regular diet.  Urinalysis shows trace leukocytes, but patient is not having any urinary symptoms, this is most likely related to contamination of the sample.  We will also provide the patient with a refill of his Valtrex for any future outbreaks.  Patient encouraged to follow-up if symptoms worsen. ?Final Clinical Impressions(s) / UC Diagnoses  ? ?Final diagnoses:  ?None  ? ?Discharge Instructions   ?None ?  ? ?ED Prescriptions   ?None ?  ? ?PDMP not reviewed this encounter. ?  ?Abran CantorLeath-Warren, Sabrea Sankey J, NP ?02/24/22 1728 ? ?

## 2022-02-24 NOTE — ED Triage Notes (Signed)
Pt reports intermittent abd pains with n/v/d for 3 days.  ?

## 2022-10-10 ENCOUNTER — Emergency Department (HOSPITAL_COMMUNITY)
Admission: EM | Admit: 2022-10-10 | Discharge: 2022-10-11 | Disposition: A | Discharge status: Home / Self Care | Payer: Federal, State, Local not specified - PPO | Attending: Emergency Medicine | Admitting: Emergency Medicine

## 2022-10-10 ENCOUNTER — Encounter (HOSPITAL_COMMUNITY): Payer: Self-pay | Admitting: Emergency Medicine

## 2022-10-10 ENCOUNTER — Other Ambulatory Visit: Payer: Self-pay

## 2022-10-10 ENCOUNTER — Emergency Department (HOSPITAL_COMMUNITY): Payer: Federal, State, Local not specified - PPO

## 2022-10-10 DIAGNOSIS — S61012A Laceration without foreign body of left thumb without damage to nail, initial encounter: Secondary | ICD-10-CM | POA: Diagnosis not present

## 2022-10-10 DIAGNOSIS — W260XXA Contact with knife, initial encounter: Secondary | ICD-10-CM | POA: Insufficient documentation

## 2022-10-10 DIAGNOSIS — Z91010 Allergy to peanuts: Secondary | ICD-10-CM | POA: Diagnosis not present

## 2022-10-10 DIAGNOSIS — S60932A Unspecified superficial injury of left thumb, initial encounter: Secondary | ICD-10-CM | POA: Diagnosis not present

## 2022-10-10 MED ORDER — HYDROCODONE-ACETAMINOPHEN 5-325 MG PO TABS
1.0000 | ORAL_TABLET | Freq: Once | ORAL | Status: AC
  Administered 2022-10-10: 1 via ORAL
  Filled 2022-10-10: qty 1

## 2022-10-10 NOTE — ED Triage Notes (Signed)
Pt brought to ED by [facility] with c/o laceration to left thumb that occurred while chopping onions this evening. Bleeding controlled to area at time of arrival to triage  EMS Vitals BP 114/78 HR 56 RR 14 SPO2 99% RA

## 2022-10-10 NOTE — ED Provider Triage Note (Signed)
Emergency Medicine Provider Triage Evaluation Note  Carl Ibarra , a 21 y.o. male  was evaluated in triage.  Pt complains of left thumb laceration.  Patient reports that he was cutting onions when his thumb slipped cutting his left thumb.  There is no nailbed involvement.  The patient states his last tetanus update was 4 years ago.  Patient neurovascularly intact to the left thumb.  No tendon involvement.  Review of Systems  Positive:  Negative:   Physical Exam  There were no vitals taken for this visit. Gen:   Awake, no distress   Resp:  Normal effort  MSK:   Moves extremities without difficulty  Other:  Patient then copiously irrigated.  Patient thumb covered and sent x-ray to rule out foreign bodies.  Medical Decision Making  Medically screening exam initiated at 9:16 PM.  Appropriate orders placed.  Carl Ibarra was informed that the remainder of the evaluation will be completed by another provider, this initial triage assessment does not replace that evaluation, and the importance of remaining in the ED until their evaluation is complete.     Al Decant, PA-C 10/10/22 2116

## 2022-10-11 MED ORDER — CEPHALEXIN 500 MG PO CAPS: 500.0000 mg | ORAL_CAPSULE | Freq: Four times a day (QID) | ORAL | 0 refills | Status: AC

## 2022-10-11 MED ORDER — CEPHALEXIN 250 MG PO CAPS
500.0000 mg | ORAL_CAPSULE | Freq: Once | ORAL | Status: AC
  Administered 2022-10-11: 500 mg via ORAL
  Filled 2022-10-11: qty 2

## 2022-10-11 MED ORDER — LIDOCAINE HCL (PF) 1 % IJ SOLN
10.0000 mg | Freq: Once | INTRAMUSCULAR | Status: AC
  Administered 2022-10-11: 10 mg
  Filled 2022-10-11: qty 5

## 2022-10-11 NOTE — Discharge Instructions (Signed)
Please read and follow all provided instructions.  Your diagnoses today include:  1. Laceration of left thumb without foreign body without damage to nail, initial encounter     Tests performed today include: X-ray of the affected area that did not show any foreign bodies or broken bones Vital signs. See below for your results today.   Medications prescribed:  Keflex (cephalexin) - antibiotic  You have been prescribed an antibiotic medicine: take the entire course of medicine even if you are feeling better. Stopping early can cause the antibiotic not to work.  Please use over-the-counter NSAID medications (ibuprofen, naproxen) or Tylenol (acetaminophen) as directed on the packaging for pain -- as long as you do not have any reasons avoid these medications. Reasons to avoid NSAID medications include: weak kidneys, a history of bleeding in your stomach or gut, or uncontrolled high blood pressure or previous heart attack. Reasons to avoid Tylenol include: liver problems or ongoing alcohol use. Never take more than 4000mg  or 8 Extra strength Tylenol in a 24 hour period.     Take any prescribed medications only as directed.   Home care instructions:  Follow any educational materials and wound care instructions contained in this packet.   Keep affected area above the level of your heart when possible to minimize swelling. Wash area gently twice a day with warm soapy water. Do not apply alcohol or hydrogen peroxide. Cover the area if it draining or weeping.   Follow-up instructions: Suture Removal: Return to the Emergency Department or see your primary care care doctor in 10 days for a recheck of your wound and removal of your sutures or staples.    Return instructions:  Return to the Emergency Department if you have: Fever Worsening pain Worsening swelling of the wound Pus draining from the wound Redness of the skin that moves away from the wound, especially if it streaks away from the  affected area  Any other emergent concerns  Your vital signs today were: BP 104/70   Pulse 64   Temp 98.4 F (36.9 C)   Resp 16   SpO2 100%  If your blood pressure (BP) was elevated above 135/85 this visit, please have this repeated by your doctor within one month. --------------

## 2022-10-11 NOTE — ED Provider Notes (Signed)
[facility] EMERGENCY DEPARTMENT Provider Note   CSN: [id number] Arrival date & time: 10/10/22  2049     History  Chief Complaint  Patient presents with   Laceration    Carl Ibarra is a 21 y.o. male.  Patient is to the emergency department today for evaluation of left thumb laceration.  Patient was cutting an onion at around 8 PM last evening with a new knife.  He accidentally cut his thumb.  Last tetanus was 4 days ago at time of thumb surgery.  Wound rinsed prior to arrival.  Denies numbness or tingling.       Home Medications Prior to Admission medications   Medication Sig Start Date End Date Taking? Authorizing Provider  cephALEXin (KEFLEX) 500 MG capsule Take 1 capsule (500 mg total) by mouth 4 (four) times daily. 10/11/22  Yes Renne Crigler, PA-C      Allergies    Bee pollen, Bee venom, and Peanut butter flavor    Review of Systems   Review of Systems  Physical Exam Updated Vital Signs BP 104/70   Pulse 64   Temp 98.4 F (36.9 C)   Resp 16   SpO2 100%   Physical Exam Vitals and nursing note reviewed.  Constitutional:      Appearance: He is well-developed.  HENT:     Head: Normocephalic and atraumatic.  Eyes:     Conjunctiva/sclera: Conjunctivae normal.  Cardiovascular:     Pulses: Normal pulses. No decreased pulses.  Musculoskeletal:        General: Tenderness present.     Cervical back: Normal range of motion and neck supple.     Right lower leg: No edema.     Left lower leg: No edema.     Comments: Left thumb: There is a near amputation of the very distal fingertip extending from the distal nail plate margin radially.  Wound base fully explored and clean. Lac is approximately 2cm.  Skin:    General: Skin is warm and dry.  Neurological:     Mental Status: He is alert.     Sensory: No sensory deficit.     Comments: Motor, sensation, and vascular distal to the injury is fully intact.   Psychiatric:        Mood and Affect:  Mood normal.     ED Results / Procedures / Treatments   Labs (all labs ordered are listed, but only abnormal results are displayed) Labs Reviewed - No data to display  EKG None  Radiology DG Finger Thumb Left  Result Date: 10/10/2022 CLINICAL DATA:  Left thumb laceration EXAM: LEFT THUMB 2+V COMPARISON:  None Available. FINDINGS: Soft tissue defect within the distal tip of the left thumb identified in keeping with given history of laceration. No retained radiopaque foreign body. Remote ORIF first proximal phalanx noted. No acute fracture or dislocation. Joint spaces are preserved. IMPRESSION: 1. Soft tissue laceration. No retained radiopaque foreign body. Electronically Signed   By: Helyn Numbers M.D.   On: 10/10/2022 22:02    Procedures .Marland KitchenLaceration Repair  Date/Time: 10/11/2022 9:40 AM  Performed by: Renne Crigler, PA-C Authorized by: Renne Crigler, PA-C   Consent:    Consent obtained:  Verbal   Consent given by:  Patient   Risks discussed:  Infection, pain and poor cosmetic result Universal protocol:    Patient identity confirmed:  Verbally with patient Anesthesia:    Anesthesia method:  Local infiltration and nerve block   Local anesthetic:  Lidocaine  1% w/o epi   Block location:  Base L thumb   Block needle gauge:  25 G   Block anesthetic:  Lidocaine 1% w/o epi   Block technique:  3-sided ring block   Block injection procedure:  Anatomic landmarks identified, introduced needle, incremental injection, negative aspiration for blood and anatomic landmarks palpated   Block outcome:  Incomplete block Laceration details:    Length (cm):  2 Pre-procedure details:    Preparation:  Patient was prepped and draped in usual sterile fashion and imaging obtained to evaluate for foreign bodies Exploration:    Imaging obtained: x-ray     Imaging outcome: foreign body not noted     Wound exploration: wound explored through full range of motion     Wound extent: no foreign body      Contaminated: no   Treatment:    Area cleansed with:  Povidone-iodine   Amount of cleaning: soak.   Irrigation solution:  Sterile water and sterile saline   Irrigation volume:  1000cc   Irrigation method:  Pressure wash (bottle cap nozzle)   Visualized foreign bodies/material removed: no     Debridement:  None Skin repair:    Repair method:  Sutures   Suture size:  5-0   Suture material:  Nylon   Suture technique:  Simple interrupted   Number of sutures:  6 Approximation:    Approximation:  Close Repair type:    Repair type:  Simple Post-procedure details:    Dressing:  Open (no dressing)   Procedure completion:  Tolerated well, no immediate complications Comments:     I tacked down the flap of tissue on the tip of the thumb.  2 sutures I placed through the fingernail as the wound margin abutted the distal nail plate.  Discussed that this flap may eventually die and slough off, but will currently be used as a organic splint to protect the underlying areas that heal secondarily.     Medications Ordered in ED Medications  HYDROcodone-acetaminophen (NORCO/VICODIN) 5-325 MG per tablet 1 tablet (1 tablet Oral Given 10/10/22 2119)  cephALEXin (KEFLEX) capsule 500 mg (500 mg Oral Given 10/11/22 0846)  lidocaine (PF) (XYLOCAINE) 1 % injection 10 mg (10 mg Infiltration Given by Other 10/11/22 0846)    ED Course/ Medical Decision Making/ A&P Clinical Course as of 10/11/22 0175  [facility] Oct 11, 2022  0636 DG Finger Thumb Left [AA]    Clinical Course User Index [AA] Marita Kansas, PA-C    Patient seen and examined. History obtained directly from patient.   Labs/EKG: None ordered  Imaging: Reviewed and personally interpreted imaging: thumb x-ray, previous surgical changes noted, agree no fracture or foreign body  Medications/Fluids: Ordered: P.o. Keflex, lidocaine 1% without epinephrine  Most recent vital signs reviewed and are as follows: BP 131/70 (BP Location: Right Arm)    Pulse 82   Temp 97.8 F (36.6 C) (Oral)   Resp 18   SpO2 100%   Initial impression: Near amputation distal tip of left thumb, no nailbed involvement.  9:43 AM Reassessment performed. Patient appears comfortable. Exam unchanged.  Wound repaired as above.  Reviewed pertinent lab work and imaging with patient at bedside. Questions answered.   Most current vital signs reviewed and are as follows: BP 131/70 (BP Location: Right Arm)   Pulse 82   Temp 97.8 F (36.6 C) (Oral)   Resp 18   SpO2 100%   Plan: Discharge to home.   Prescriptions written for: Keflex  Other  home care instructions discussed: Patient counseled on wound care.    ED return instructions discussed: Patient was urged to return to the Emergency Department urgently with worsening pain, swelling, expanding erythema especially if it streaks away from the affected area, fever, or if they have any other concerns.   Follow-up instructions discussed: Patient counseled on need to return or see PCP/urgent care for suture removal in 10 days.                           Medical Decision Making Risk Prescription drug management.   Patient with near amputation of the tip of the left thumb, no bony or nailbed involvement.  Should heal well secondarily.  No foreign bodies.  X-ray negative for fracture.  Flap secured with sutures.  Patient will be given 5 days of antibiotic prophylaxis.  Tetanus up-to-date.        Final Clinical Impression(s) / ED Diagnoses Final diagnoses:  Laceration of left thumb without foreign body without damage to nail, initial encounter    Rx / DC Orders ED Discharge Orders          Ordered    cephALEXin (KEFLEX) 500 MG capsule  4 times daily        10/11/22 0904              Renne Crigler, PA-C 10/11/22 0945    Tegeler, Canary Brim, MD 10/11/22 1041

## 2024-09-15 ENCOUNTER — Ambulatory Visit (HOSPITAL_COMMUNITY): Admission: EM | Admit: 2024-09-15 | Discharge: 2024-09-15 | Discharge status: Against Medical Advice

## 2024-09-15 NOTE — ED Notes (Signed)
 No answer in waiting x2.

## 2024-09-15 NOTE — ED Notes (Signed)
No answer in waiting area.

## 2024-12-05 ENCOUNTER — Other Ambulatory Visit: Payer: Self-pay

## 2024-12-05 ENCOUNTER — Emergency Department
Admission: EM | Admit: 2024-12-05 | Discharge: 2024-12-06 | Disposition: A | Discharge status: Home / Self Care | Attending: Emergency Medicine | Admitting: Emergency Medicine

## 2024-12-05 DIAGNOSIS — S0121XA Laceration without foreign body of nose, initial encounter: Secondary | ICD-10-CM | POA: Insufficient documentation

## 2024-12-05 DIAGNOSIS — W540XXA Bitten by dog, initial encounter: Secondary | ICD-10-CM | POA: Insufficient documentation

## 2024-12-05 DIAGNOSIS — Z23 Encounter for immunization: Secondary | ICD-10-CM | POA: Insufficient documentation

## 2024-12-05 DIAGNOSIS — S0185XA Open bite of other part of head, initial encounter: Secondary | ICD-10-CM

## 2024-12-05 DIAGNOSIS — S01511A Laceration without foreign body of lip, initial encounter: Secondary | ICD-10-CM | POA: Diagnosis not present

## 2024-12-05 DIAGNOSIS — S0992XA Unspecified injury of nose, initial encounter: Secondary | ICD-10-CM | POA: Diagnosis present

## 2024-12-05 NOTE — ED Triage Notes (Signed)
 Pt presents with dog bite to the nose, upper lip, and lower lip. Sts its his personal dog who is vaccinated. No LOC.

## 2024-12-06 ENCOUNTER — Encounter: Payer: Self-pay | Admitting: Emergency Medicine

## 2024-12-06 MED ORDER — LIDOCAINE-PRILOCAINE 2.5-2.5 % EX CREA
TOPICAL_CREAM | Freq: Once | CUTANEOUS | Status: AC
  Filled 2024-12-06: qty 5

## 2024-12-06 MED ORDER — OXYCODONE HCL 5 MG PO TABS: 5.0000 mg | ORAL_TABLET | Freq: Three times a day (TID) | ORAL | 0 refills | Status: AC | PRN

## 2024-12-06 MED ORDER — LIDOCAINE HCL (PF) 1 % IJ SOLN
5.0000 mL | Freq: Once | INTRAMUSCULAR | Status: AC
  Administered 2024-12-06: 5 mL via INTRADERMAL
  Filled 2024-12-06: qty 5

## 2024-12-06 MED ORDER — IBUPROFEN 800 MG PO TABS: 800.0000 mg | ORAL_TABLET | Freq: Three times a day (TID) | ORAL | 0 refills | Status: AC | PRN

## 2024-12-06 MED ORDER — ONDANSETRON 4 MG PO TBDP
4.0000 mg | ORAL_TABLET | Freq: Once | ORAL | Status: AC
  Administered 2024-12-06: 4 mg via ORAL
  Filled 2024-12-06: qty 1

## 2024-12-06 MED ORDER — BACITRACIN ZINC 500 UNIT/GM EX OINT
TOPICAL_OINTMENT | Freq: Once | CUTANEOUS | Status: AC
  Administered 2024-12-06: 1 via TOPICAL
  Filled 2024-12-06: qty 0.9

## 2024-12-06 MED ORDER — ONDANSETRON 4 MG PO TBDP: 4.0000 mg | ORAL_TABLET | Freq: Four times a day (QID) | ORAL | 0 refills | Status: AC | PRN

## 2024-12-06 MED ORDER — OXYCODONE HCL 5 MG PO TABS
5.0000 mg | ORAL_TABLET | Freq: Once | ORAL | Status: AC
  Administered 2024-12-06: 5 mg via ORAL
  Filled 2024-12-06: qty 1

## 2024-12-06 MED ORDER — AMOXICILLIN-POT CLAVULANATE 875-125 MG PO TABS: 1.0000 | ORAL_TABLET | Freq: Two times a day (BID) | ORAL | 0 refills | Status: AC

## 2024-12-06 MED ORDER — AMOXICILLIN-POT CLAVULANATE 875-125 MG PO TABS
1.0000 | ORAL_TABLET | Freq: Once | ORAL | Status: AC
  Administered 2024-12-06: 1 via ORAL
  Filled 2024-12-06: qty 1

## 2024-12-06 MED ORDER — TETANUS-DIPHTH-ACELL PERTUSSIS 5-2-15.5 LF-MCG/0.5 IM SUSP
0.5000 mL | Freq: Once | INTRAMUSCULAR | Status: AC
  Administered 2024-12-06: 0.5 mL via INTRAMUSCULAR
  Filled 2024-12-06: qty 0.5

## 2024-12-06 NOTE — ED Notes (Signed)
 Dr. Neomi at bedside for suture repair.

## 2024-12-06 NOTE — ED Provider Notes (Signed)
 "  [facility] Provider Note    Event Date/Time   First MD Initiated Contact with Patient 12/05/24 2315     (approximate)   History   Animal Bite   HPI  Carl Ibarra is a 24 y.o. male with no significant past medical history who presents the emergency department after his pitbull bit him in the face.  Has superficial abrasion to the end of the nose and a laceration to the upper lip.  Unsure of his last tetanus vaccine.  Denies any other injury.  Dog is up-to-date on all vaccinations.   History provided by patient.    Past Medical History:  Diagnosis Date   Fracture of thumb, left, closed 10/13/2015   prox. phalanx - fell from skateboard   Urinary frequency     Past Surgical History:  Procedure Laterality Date   LEG TENDON SURGERY     OPEN REDUCTION INTERNAL FIXATION (ORIF) PROXIMAL PHALANX Left 11/04/2015   Procedure: OPEN TREATMENT OF LEFT THUMB PROXIMAL PHALANX FRACTURE;  Surgeon: Alm Hummer, MD;  Location: [facility];  Service: Orthopedics;  Laterality: Left;    MEDICATIONS:  Prior to Admission medications  Medication Sig Start Date End Date Taking? Authorizing Provider  cephALEXin  (KEFLEX ) 500 MG capsule Take 1 capsule (500 mg total) by mouth 4 (four) times daily. 10/11/22   Desiderio Chew, PA-C    Physical Exam   Triage Vital Signs: ED Triage Vitals [12/05/24 1947]  Encounter Vitals Group     BP 135/77     Girls Systolic BP Percentile      Girls Diastolic BP Percentile      Boys Systolic BP Percentile      Boys Diastolic BP Percentile      Pulse Rate 69     Resp 14     Temp 98.6 F (37 C)     Temp Source Oral     SpO2 97 %     Weight      Height      Head Circumference      Peak Flow      Pain Score 8     Pain Loc      Pain Education      Exclude from Growth Chart     Most recent vital signs: Vitals:   12/05/24 1947 12/05/24 2329  BP: 135/77 115/72  Pulse: 69 66  Resp: 14 16  Temp: 98.6 F (37 C)  98.2 F (36.8 C)  SpO2: 97% 99%    CONSTITUTIONAL: Alert, responds appropriately to questions. Well-appearing; well-nourished, pleasant HEAD: Normocephalic, atraumatic EYES: Conjunctivae clear, pupils appear equal, sclera nonicteric ENT: normal nose; moist mucous membranes, 2 cm laceration to the lower lip, 3 cm laceration to the upper lip, 2 cm laceration to the tip of the nose NECK: Supple, normal ROM CARD: RRR; S1 and S2 appreciated RESP: Normal chest excursion without splinting or tachypnea; breath sounds clear and equal bilaterally; no wheezes, no rhonchi, no rales, no hypoxia or respiratory distress, speaking full sentences ABD/GI: Non-distended; soft, non-tender, no rebound, no guarding, no peritoneal signs BACK: The back appears normal EXT: Normal ROM in all joints; no deformity noted, no edema SKIN: Normal color for age and race; warm; no rash on exposed skin NEURO: Moves all extremities equally, normal speech PSYCH: The patient's mood and manner are appropriate.   ED Results / Procedures / Treatments   LABS: (all labs ordered are listed, but only abnormal results are displayed) Labs Reviewed -  No data to display   EKG:   RADIOLOGY: My personal review and interpretation of imaging:    I have personally reviewed all radiology reports.   No results found.   PROCEDURES:  Critical Care performed: No      Procedures  LACERATION REPAIR Performed by: Josette Sink Authorized by: Josette Sink Consent: Verbal consent obtained. Risks and benefits: risks, benefits and alternatives were discussed Consent given by: patient Patient identity confirmed: provided demographic data Prepped and Draped in normal sterile fashion Wound explored  Laceration Location: nose, lower lip, upper lip  Laceration Length: 2 cm, 2 cm, 3 cm  No Foreign Bodies seen or palpated  Anesthesia: local infiltration  Local anesthetic: lidocaine  1% without epinephrine   Anesthetic total:  4 ml  Irrigation method: syringe Amount of cleaning: standard  Skin closure: Superficial  Number of sutures: 3, 3, 7  Technique: Area anesthetized using lidocaine  1% with epinephrine . Wound irrigated copiously with sterile saline. Wound then cleaned with chlorhexidine and draped in sterile fashion. Wound closed using a total of 13 simple interrupted sutures with 5 and 6-0 Vicryl.  Bacitracin  applied. Good wound approximation and hemostasis achieved.    Patient tolerance: Patient tolerated the procedure well with no immediate complications.   IMPRESSION / MDM / ASSESSMENT AND PLAN / ED COURSE  I reviewed the triage vital signs and the nursing notes.    Patient here after dog bites to the face.  Dog is up-to-date on rabies vaccinations.    DIFFERENTIAL DIAGNOSIS (includes but not limited to):   Dog bite, no sign of infection or retained foreign body, no signs of facial fracture   Patient's presentation is most consistent with acute complicated illness / injury requiring diagnostic workup.   PLAN: Will clean and repair his wounds.  Will update his tetanus vaccine.  Will give antibiotics and pain medication.   MEDICATIONS GIVEN IN ED: Medications  Tdap (ADACEL ) injection 0.5 mL (0.5 mLs Intramuscular Given 12/06/24 0048)  oxyCODONE  (Oxy IR/ROXICODONE ) immediate release tablet 5 mg (5 mg Oral Given 12/06/24 0047)  ondansetron  (ZOFRAN -ODT) disintegrating tablet 4 mg (4 mg Oral Given 12/06/24 0047)  amoxicillin -clavulanate (AUGMENTIN ) 875-125 MG per tablet 1 tablet (1 tablet Oral Given 12/06/24 0047)  bacitracin  ointment (1 Application Topical Given 12/06/24 0114)  lidocaine  (PF) (XYLOCAINE ) 1 % injection 5 mL (5 mLs Intradermal Given by Other 12/06/24 0049)  lidocaine -prilocaine  (EMLA ) cream ( Topical Given 12/06/24 0049)     ED COURSE: Patient tolerated sutures well.  Total of 13 simple interrupted absorbable sutures in place.  Discussed wound care instructions and return precautions.  Will  discharge with antibiotics and pain medication.  He is comfortable with this plan.  At this time, I do not feel there is any life-threatening condition present. I reviewed all nursing notes, vitals, pertinent previous records.  All lab and urine results, EKGs, imaging ordered have been independently reviewed and interpreted by myself.  I reviewed all available radiology reports from any imaging ordered this visit.  Based on my assessment, I feel the patient is safe to be discharged home without further emergent workup and can continue workup as an outpatient as needed. Discussed all findings, treatment plan as well as usual and customary return precautions.  They verbalize understanding and are comfortable with this plan.  Outpatient follow-up has been provided as needed.  All questions have been answered.    CONSULTS:  none   OUTSIDE RECORDS REVIEWED: Reviewed previous pediatric urology note in 2013.  FINAL CLINICAL IMPRESSION(S) / ED DIAGNOSES   Final diagnoses:  Dog bite of face, initial encounter     Rx / DC Orders   ED Discharge Orders          Ordered    amoxicillin -clavulanate (AUGMENTIN ) 875-125 MG tablet  2 times daily        12/06/24 0200    oxyCODONE  (ROXICODONE ) 5 MG immediate release tablet  Every 8 hours PRN        12/06/24 0200    ondansetron  (ZOFRAN -ODT) 4 MG disintegrating tablet  Every 6 hours PRN        12/06/24 0200    ibuprofen  (ADVIL ) 800 MG tablet  Every 8 hours PRN        12/06/24 0200             Note:  This document was prepared using Dragon voice recognition software and may include unintentional dictation errors.   Acheron Sugg, Josette SAILOR, DO 12/06/24 [phone]  "

## 2024-12-06 NOTE — Discharge Instructions (Addendum)
 You may alternate over the counter Tylenol 1000 mg every 6 hours as needed for pain, fever and Ibuprofen 800 mg every 6-8 hours as needed for pain, fever.  Please take Ibuprofen with food.  Do not take more than 4000 mg of Tylenol (acetaminophen) in a 24 hour period.  You may clean your wound gently daily with soap and warm water and then pat dry.  Your sutures are absorbable and will come out on their own in the next 2 to 3 weeks.  If they have not come out by the end of 3 weeks, you may return to the emergency department, urgent care or your PCP for removal.  Please do not apply over-the-counter antibiotic ointment as this can breakdown absorbable sutures too quickly.  Also many people have reactions to these ointments and can delay wound healing rather than help it.  Please keep your wound clean and dry.  You may leave it uncovered or cover it with a dressing daily.  All wounds have the risk of becoming infected.  We do everything sterilely to try to prevent this and clean the wound with saline, Betadine or chlorhexidine.  If your wound becomes red, warm, draining pus, has a foul odor or you have a fever of 100.4 or higher, please return to the emergency department or urgent care.  All wounds also have the risk of scarring.  I recommend once your wound has completely healed and your sutures have come out or have been removed that you make sure to keep sunscreen on your scars every day for at least the next year.  You may also use over-the-counter Mederma once the wound is healed which can help with scar formation.    You are being provided a prescription for opiates (also known as narcotics) for pain control.  Opiates can be addictive and should only be used when absolutely necessary for pain control when other alternatives do not work.  We recommend you only use them for the recommended amount of time and only as prescribed.  Please do not take with other sedative medications or alcohol.  Please  do not drive, operate machinery, make important decisions while taking opiates.  Please note that these medications can be addictive and have high abuse potential.  Patients can become addicted to narcotics after only taking them for a few days.  Please keep these medications locked away from children, teenagers or any family members with history of substance abuse.  Narcotic pain medicine may also make you constipated.  You may use over-the-counter medications such as MiraLAX, Colace to prevent constipation.  If you become constipated, you may use over-the-counter enemas as needed.  Itching and nausea are also common side effects of narcotic pain medication.  If you develop uncontrolled vomiting or a rash, please stop these medications and seek medical care.
